# Patient Record
Sex: Female | Born: 1985 | Race: Asian | Hispanic: No | Marital: Married | Smoking: Never smoker
Health system: Southern US, Community
[De-identification: ages and names within clinical notes are randomized; demographics above are authoritative.]

## PROBLEM LIST (undated history)

## (undated) ENCOUNTER — Inpatient Hospital Stay (HOSPITAL_COMMUNITY): Payer: Self-pay

## (undated) DIAGNOSIS — Z789 Other specified health status: Secondary | ICD-10-CM

## (undated) HISTORY — PX: NO PAST SURGERIES: SHX2092

---

## 2018-01-25 LAB — OB RESULTS CONSOLE HIV ANTIBODY (ROUTINE TESTING): HIV: NONREACTIVE

## 2018-01-25 LAB — OB RESULTS CONSOLE RUBELLA ANTIBODY, IGM: Rubella: IMMUNE

## 2018-01-25 LAB — OB RESULTS CONSOLE ABO/RH: RH Type: POSITIVE

## 2018-01-25 LAB — OB RESULTS CONSOLE GC/CHLAMYDIA
Chlamydia: NEGATIVE
Gonorrhea: NEGATIVE

## 2018-01-25 LAB — OB RESULTS CONSOLE HEPATITIS B SURFACE ANTIGEN: HEP B S AG: NEGATIVE

## 2018-02-18 ENCOUNTER — Encounter (HOSPITAL_COMMUNITY): Payer: Self-pay | Admitting: *Deleted

## 2018-02-18 ENCOUNTER — Observation Stay (HOSPITAL_COMMUNITY)
Admission: AD | Admit: 2018-02-18 | Payer: BLUE CROSS/BLUE SHIELD | Source: Ambulatory Visit | Admitting: Obstetrics and Gynecology

## 2018-02-18 ENCOUNTER — Inpatient Hospital Stay (HOSPITAL_BASED_OUTPATIENT_CLINIC_OR_DEPARTMENT_OTHER): Payer: BLUE CROSS/BLUE SHIELD

## 2018-02-18 ENCOUNTER — Inpatient Hospital Stay (HOSPITAL_COMMUNITY)
Admission: AD | Admit: 2018-02-18 | Discharge: 2018-02-18 | Disposition: A | Discharge status: Home / Self Care | Payer: BLUE CROSS/BLUE SHIELD | Source: Ambulatory Visit | Attending: Obstetrics and Gynecology | Admitting: Obstetrics and Gynecology

## 2018-02-18 DIAGNOSIS — Z3A20 20 weeks gestation of pregnancy: Secondary | ICD-10-CM | POA: Insufficient documentation

## 2018-02-18 DIAGNOSIS — O4702 False labor before 37 completed weeks of gestation, second trimester: Secondary | ICD-10-CM

## 2018-02-18 DIAGNOSIS — O4412 Placenta previa with hemorrhage, second trimester: Secondary | ICD-10-CM

## 2018-02-18 DIAGNOSIS — M549 Dorsalgia, unspecified: Secondary | ICD-10-CM | POA: Diagnosis present

## 2018-02-18 DIAGNOSIS — O26892 Other specified pregnancy related conditions, second trimester: Secondary | ICD-10-CM | POA: Diagnosis not present

## 2018-02-18 DIAGNOSIS — O4692 Antepartum hemorrhage, unspecified, second trimester: Secondary | ICD-10-CM | POA: Diagnosis not present

## 2018-02-18 DIAGNOSIS — O479 False labor, unspecified: Secondary | ICD-10-CM

## 2018-02-18 DIAGNOSIS — O26852 Spotting complicating pregnancy, second trimester: Secondary | ICD-10-CM

## 2018-02-18 DIAGNOSIS — O4402 Placenta previa specified as without hemorrhage, second trimester: Secondary | ICD-10-CM | POA: Diagnosis not present

## 2018-02-18 DIAGNOSIS — O4700 False labor before 37 completed weeks of gestation, unspecified trimester: Secondary | ICD-10-CM

## 2018-02-18 HISTORY — DX: Other specified health status: Z78.9

## 2018-02-18 LAB — URINALYSIS, ROUTINE W REFLEX MICROSCOPIC
BILIRUBIN URINE: NEGATIVE
Glucose, UA: NEGATIVE mg/dL
Hgb urine dipstick: NEGATIVE
KETONES UR: NEGATIVE mg/dL
Leukocytes, UA: NEGATIVE
NITRITE: NEGATIVE
PROTEIN: NEGATIVE mg/dL
Specific Gravity, Urine: 1.004 — ABNORMAL LOW (ref 1.005–1.030)
pH: 8 (ref 5.0–8.0)

## 2018-02-18 LAB — ABO/RH: ABO/RH(D): A POS

## 2018-02-18 MED ORDER — CYCLOBENZAPRINE HCL 10 MG PO TABS
10.0000 mg | ORAL_TABLET | Freq: Once | ORAL | Status: AC
  Administered 2018-02-18: 10 mg via ORAL
  Filled 2018-02-18: qty 1

## 2018-02-18 MED ORDER — LACTATED RINGERS IV BOLUS
1000.0000 mL | Freq: Once | INTRAVENOUS | Status: AC
  Administered 2018-02-18: 1000 mL via INTRAVENOUS

## 2018-02-18 MED ORDER — LACTATED RINGERS IV BOLUS
500.0000 mL | Freq: Once | INTRAVENOUS | Status: AC
  Administered 2018-02-18: 500 mL via INTRAVENOUS

## 2018-02-18 MED ORDER — NIFEDIPINE 10 MG PO CAPS
10.0000 mg | ORAL_CAPSULE | ORAL | Status: AC
  Administered 2018-02-18 (×3): 10 mg via ORAL
  Filled 2018-02-18 (×3): qty 1

## 2018-02-18 MED ORDER — NIFEDIPINE ER OSMOTIC RELEASE 30 MG PO TB24: 30.0000 mg | ORAL_TABLET | Freq: Two times a day (BID) | ORAL | 0 refills | Status: DC

## 2018-02-18 NOTE — Discharge Instructions (Signed)

## 2018-02-18 NOTE — MAU Provider Note (Signed)
History     CSN: [id number]  Arrival date and time: 02/18/18 1011   First Provider Initiated Contact with Patient 02/18/18 1053      Chief Complaint  Patient presents with  . Vaginal Discharge  . Back Pain   HPI  Ms.  Yvonne Watts is a 32 y.o. year old G1P0 female at [redacted]w[redacted]d weeks gestation who presents to MAU reporting brown, mucousy discharge, lower RT side, and intermittent back pain. She has a h/o placenta previa. There is no blood type listed on her prenatal record in Epic. The patient looked on her patient portal and found it to be A positive. She started [facility] with [facility] (Dr. Juliene Pina), but has transferred her care [facility] "about 1 month ago."  Past Medical History:  Diagnosis Date  . Medical history non-contributory     Past Surgical History:  Procedure Laterality Date  . NO PAST SURGERIES      History reviewed. No pertinent family history.  Social History   Tobacco Use  . Smoking status: Never Smoker  . Smokeless tobacco: Never Used  Substance Use Topics  . Alcohol use: Not Currently  . Drug use: Never    Allergies: No Known Allergies  No medications prior to admission.    Review of Systems  Constitutional: Negative.   HENT: Negative.   Eyes: Negative.   Respiratory: Negative.   Cardiovascular: Negative.   Gastrointestinal: Positive for abdominal pain (cramping).  Endocrine: Negative.   Genitourinary: Positive for vaginal bleeding (brown, mucous).  Musculoskeletal: Positive for back pain (lower RT side).  Skin: Negative.   Allergic/Immunologic: Negative.   Neurological: Negative.   Hematological: Negative.   Psychiatric/Behavioral: Negative.    Physical Exam   Blood pressure 119/72, pulse 96, temperature 97.6 F (36.4 C), temperature source Oral, resp. rate 16, SpO2 98 %.  Physical Exam  Nursing note and vitals reviewed. Constitutional: She is oriented to person, place, and time. She appears well-developed and well-nourished.  HENT:   Head: Normocephalic and atraumatic.  Eyes: Pupils are equal, round, and reactive to light.  Neck: Normal range of motion.  Cardiovascular: Normal rate.  Respiratory: Effort normal.  GI: Soft.  Musculoskeletal: Normal range of motion.  Neurological: She is alert and oriented to person, place, and time.  Skin: Skin is warm and dry.  Psychiatric: She has a normal mood and affect. Her behavior is normal. Judgment and thought content normal.    MAU Course  Procedures  MDM CCUA ABO/Rh OB MFM Limited U/S FHTs by doppler: 155 bpm Flexeril 10 mg -- improved pain from 4-5/10 to 3/10 Toco only: regular every 1-3 mins  LR 1000 ml bolus x 2 liters Procardia 10 mg every 20 mins x 3 doses Regular diet   *Consult with Dr. Shawnie Pons @ 1435 - notified of patient's complaints, assessments, lab & U/S results, tx plan discuss bleeding precautions and risks of miscarriage with placenta previa, Flexeril for back pain, F/U with [facility] - ok to d/c home, agrees with plan // Update Dr. Shawnie Pons @ 1600 on patient's request to be monitored for contractions & toco results -- can offer Indocin x 3 days and F/U with OB or can give Procardia 10 mg every 20 mins here and send home with Rx for and F/U with OB // Update Dr. Shawnie Pons @ 1722: ok to d/c home with Rx for Procardia 30 mg BID, F/U with OB early next week.  Results for orders placed or performed during the hospital encounter  of 02/18/18 (from the past 24 hour(s))  Urinalysis, Routine w reflex microscopic     Status: Abnormal   Collection Time: 02/18/18 10:37 AM  Result Value Ref Range   Color, Urine STRAW (A) YELLOW   APPearance CLEAR CLEAR   Specific Gravity, Urine 1.004 (L) 1.005 - 1.030   pH 8.0 5.0 - 8.0   Glucose, UA NEGATIVE NEGATIVE mg/dL   Hgb urine dipstick NEGATIVE NEGATIVE   Bilirubin Urine NEGATIVE NEGATIVE   Ketones, ur NEGATIVE NEGATIVE mg/dL   Protein, ur NEGATIVE NEGATIVE mg/dL   Nitrite NEGATIVE NEGATIVE   Leukocytes, UA NEGATIVE  NEGATIVE   [place] MFM Ob Limited  Result Date: 02/18/2018 ----------------------------------------------------------------------  OBSTETRICS REPORT                        (Signed Final 02/18/2018 02:30 pm) ---------------------------------------------------------------------- Patient Info  ID #:       [id number]                          D.O.B.:  [birth year] (32 yrs)  Name:       Yvonne Watts                         Visit Date: 02/18/2018 01:15 pm ---------------------------------------------------------------------- Performed By  Performed By:     Birdena Crandall        Ref. Address:      [address]                                                              [place], [place]                                                              [address]  Attending:        Toma Copier MD            Secondary Phy.:    MAU Nursing-                                                              MAU/Triage  Referred By:      Dia Sitter                Location:          [facility]  Oslo Huntsman CNM ---------------------------------------------------------------------- Orders   #  Description                           Code        Ordered By   1  [place] MFM OB LIMITED                     76815.01    Ida Milbrath   2  [place] MFM OB TRANSVAGINAL                [id number]     Arleen Bar  ----------------------------------------------------------------------   #  Order #                     Accession #                Episode #   1  [id number]                   [id number]                 [id number]   2  [id number]                   [id number]                 [id number]  ---------------------------------------------------------------------- Indications   Placenta previa with hemorrhage, second        O44.12   trimester   Vaginal bleeding in pregnancy, second          O46.92   trimester   [redacted] weeks gestation of pregnancy                Z3A.20   ---------------------------------------------------------------------- Fetal Evaluation  Num Of Fetuses:          1  Fetal Heart Rate(bpm):   138  Cardiac Activity:        Observed  Presentation:            Cephalic  Placenta:                Posterior Previa  P. Cord Insertion:       Marginal insertion  Amniotic Fluid  AFI FV:      Within normal limits                              Largest Pocket(cm)                              5.19 ---------------------------------------------------------------------- OB History  Gravidity:    1 ---------------------------------------------------------------------- Gestational Age  LMP:           20w 1d        Date:  09/30/17                 EDD:   07/07/18  Best:          20w 1d     Det. By:  LMP  (09/30/17)          EDD:   07/07/18 ---------------------------------------------------------------------- Anatomy  Ventricles:            Appears normal         Bladder:                Appears normal  Kidneys:  Appear normal          Lower Extremities:      Visualized ---------------------------------------------------------------------- Cervix Uterus Adnexa  Cervix  Length:           3.45  cm.  Normal appearance by transvaginal scan ---------------------------------------------------------------------- Impression  IUP at 20w 1d  Placenta Previa with Marginal placental cord insertion  adjacent to cervix os  Cervical Length = 3.45 cms ---------------------------------------------------------------------- Recommendations  Bleeding precautions with reevaluation in 2-3 weeks. ----------------------------------------------------------------------                      Toma Copier, MD Electronically Signed Final Report   02/18/2018 02:30 pm ----------------------------------------------------------------------   Assessment and Plan  Preterm contractions - Rx for Procardia 30 mg BID given - Information provided on preventing PTL & PTB   Spotting affecting pregnancy in second  trimester  - Advised that spotting is from placenta previa - Advised to return to MAU for blood soaking through 1 pad/hr  Placenta previa antepartum in second trimester - Information provided on placenta previa - Advised to continue pelvic rest - Call OB for BRB   - Discharge patient - Call placed to [facility] answering service (spoke with [place]) to request message be given to office staff to get patient seen either Monday 11/11 or 11/12. - Patient verbalized an understanding of the plan of care and agrees.    Raelyn Mora, MSN, CNM 02/18/2018, 10:53 AM

## 2018-02-18 NOTE — MAU Note (Signed)
Yvonne Watts is a 32 y.o. at [redacted]w[redacted]d here in MAU reporting: +brown mucus discharge +lower right back pain. intermittent Pain score: 4/10 Reports that she has a placenta previa Vitals:   02/18/18 1035  BP: 119/72  Pulse: 96  Resp: 16  Temp: 97.6 F (36.4 C)  SpO2: 98%     +FM Lab orders placed from triage: ua

## 2018-06-17 ENCOUNTER — Encounter (HOSPITAL_COMMUNITY): Payer: Self-pay | Admitting: *Deleted

## 2018-06-17 ENCOUNTER — Inpatient Hospital Stay (HOSPITAL_COMMUNITY): Payer: BLUE CROSS/BLUE SHIELD | Admitting: Anesthesiology

## 2018-06-17 ENCOUNTER — Other Ambulatory Visit: Payer: Self-pay

## 2018-06-17 ENCOUNTER — Inpatient Hospital Stay (HOSPITAL_COMMUNITY)
Admission: AD | Admit: 2018-06-17 | Discharge: 2018-06-20 | DRG: 788 | Disposition: A | Discharge status: Home / Self Care | Payer: BLUE CROSS/BLUE SHIELD | Attending: Obstetrics and Gynecology | Admitting: Obstetrics and Gynecology

## 2018-06-17 ENCOUNTER — Encounter (HOSPITAL_COMMUNITY)
Admission: AD | Disposition: A | Discharge status: Home / Self Care | Payer: Self-pay | Source: Home / Self Care | Attending: Obstetrics and Gynecology

## 2018-06-17 DIAGNOSIS — O358XX0 Maternal care for other (suspected) fetal abnormality and damage, not applicable or unspecified: Principal | ICD-10-CM | POA: Diagnosis present

## 2018-06-17 DIAGNOSIS — O9081 Anemia of the puerperium: Secondary | ICD-10-CM | POA: Diagnosis not present

## 2018-06-17 DIAGNOSIS — O26893 Other specified pregnancy related conditions, third trimester: Secondary | ICD-10-CM | POA: Diagnosis present

## 2018-06-17 DIAGNOSIS — O4700 False labor before 37 completed weeks of gestation, unspecified trimester: Secondary | ICD-10-CM

## 2018-06-17 DIAGNOSIS — O479 False labor, unspecified: Secondary | ICD-10-CM

## 2018-06-17 DIAGNOSIS — O26852 Spotting complicating pregnancy, second trimester: Secondary | ICD-10-CM

## 2018-06-17 DIAGNOSIS — Z3A37 37 weeks gestation of pregnancy: Secondary | ICD-10-CM | POA: Diagnosis not present

## 2018-06-17 DIAGNOSIS — Z98891 History of uterine scar from previous surgery: Secondary | ICD-10-CM

## 2018-06-17 LAB — CBC
HCT: 32.9 % — ABNORMAL LOW (ref 36.0–46.0)
HEMOGLOBIN: 10.4 g/dL — AB (ref 12.0–15.0)
MCH: 27.7 pg (ref 26.0–34.0)
MCHC: 31.6 g/dL (ref 30.0–36.0)
MCV: 87.7 fL (ref 80.0–100.0)
Platelets: 223 10*3/uL (ref 150–400)
RBC: 3.75 MIL/uL — ABNORMAL LOW (ref 3.87–5.11)
RDW: 12.9 % (ref 11.5–15.5)
WBC: 7.8 10*3/uL (ref 4.0–10.5)
nRBC: 0 % (ref 0.0–0.2)

## 2018-06-17 LAB — ABO/RH: ABO/RH(D): A POS

## 2018-06-17 LAB — OB RESULTS CONSOLE GBS: GBS: NEGATIVE

## 2018-06-17 LAB — POCT FERN TEST: POCT FERN TEST: POSITIVE

## 2018-06-17 LAB — TYPE AND SCREEN
ABO/RH(D): A POS
Antibody Screen: NEGATIVE

## 2018-06-17 LAB — RPR: RPR Ser Ql: NONREACTIVE

## 2018-06-17 SURGERY — Surgical Case
Anesthesia: Epidural

## 2018-06-17 MED ORDER — OXYTOCIN BOLUS FROM INFUSION: 500.0000 mL | Freq: Once | INTRAVENOUS | Status: DC

## 2018-06-17 MED ORDER — SODIUM CHLORIDE (PF) 0.9 % IJ SOLN
INTRAMUSCULAR | Status: DC | PRN
  Administered 2018-06-17: 12 mL/h via EPIDURAL

## 2018-06-17 MED ORDER — NALBUPHINE HCL 10 MG/ML IJ SOLN: 5.0000 mg | Freq: Once | INTRAMUSCULAR | Status: DC | PRN

## 2018-06-17 MED ORDER — SCOPOLAMINE 1 MG/3DAYS TD PT72
MEDICATED_PATCH | TRANSDERMAL | Status: AC
  Filled 2018-06-17: qty 1

## 2018-06-17 MED ORDER — DIBUCAINE 1 % RE OINT: 1.0000 "application " | TOPICAL_OINTMENT | RECTAL | Status: DC | PRN

## 2018-06-17 MED ORDER — OXYCODONE-ACETAMINOPHEN 5-325 MG PO TABS: 1.0000 | ORAL_TABLET | ORAL | Status: DC | PRN

## 2018-06-17 MED ORDER — PHENYLEPHRINE 40 MCG/ML (10ML) SYRINGE FOR IV PUSH (FOR BLOOD PRESSURE SUPPORT)
80.0000 ug | PREFILLED_SYRINGE | INTRAVENOUS | Status: DC | PRN
  Filled 2018-06-17: qty 10

## 2018-06-17 MED ORDER — SCOPOLAMINE 1 MG/3DAYS TD PT72: 1.0000 | MEDICATED_PATCH | Freq: Once | TRANSDERMAL | Status: DC

## 2018-06-17 MED ORDER — HYDROMORPHONE HCL 1 MG/ML IJ SOLN: 0.2500 mg | INTRAMUSCULAR | Status: DC | PRN

## 2018-06-17 MED ORDER — OXYTOCIN 40 UNITS IN NORMAL SALINE INFUSION - SIMPLE MED: 2.5000 [IU]/h | INTRAVENOUS | Status: AC

## 2018-06-17 MED ORDER — KETOROLAC TROMETHAMINE 30 MG/ML IJ SOLN
30.0000 mg | Freq: Four times a day (QID) | INTRAMUSCULAR | Status: AC | PRN
  Administered 2018-06-17: 30 mg via INTRAMUSCULAR

## 2018-06-17 MED ORDER — OXYTOCIN 40 UNITS IN NORMAL SALINE INFUSION - SIMPLE MED
1.0000 m[IU]/min | INTRAVENOUS | Status: DC
  Administered 2018-06-17: 2 m[IU]/min via INTRAVENOUS
  Filled 2018-06-17: qty 1000

## 2018-06-17 MED ORDER — DIPHENHYDRAMINE HCL 25 MG PO CAPS: 25.0000 mg | ORAL_CAPSULE | Freq: Four times a day (QID) | ORAL | Status: DC | PRN

## 2018-06-17 MED ORDER — LIDOCAINE-EPINEPHRINE (PF) 2 %-1:200000 IJ SOLN
INTRAMUSCULAR | Status: DC | PRN
  Administered 2018-06-17: 3 mL via EPIDURAL
  Administered 2018-06-17: 4 mL via EPIDURAL
  Administered 2018-06-17: 7 mL via EPIDURAL

## 2018-06-17 MED ORDER — PHENYLEPHRINE 40 MCG/ML (10ML) SYRINGE FOR IV PUSH (FOR BLOOD PRESSURE SUPPORT)
40.0000 ug | PREFILLED_SYRINGE | Freq: Once | INTRAVENOUS | Status: AC
  Administered 2018-06-17: 40 ug via INTRAVENOUS

## 2018-06-17 MED ORDER — DEXAMETHASONE SODIUM PHOSPHATE 4 MG/ML IJ SOLN
INTRAMUSCULAR | Status: DC | PRN
  Administered 2018-06-17: 4 mg via INTRAVENOUS

## 2018-06-17 MED ORDER — CEFAZOLIN SODIUM-DEXTROSE 2-4 GM/100ML-% IV SOLN: 2.0000 g | Freq: Once | INTRAVENOUS | Status: DC

## 2018-06-17 MED ORDER — ONDANSETRON HCL 4 MG/2ML IJ SOLN: 4.0000 mg | Freq: Three times a day (TID) | INTRAMUSCULAR | Status: DC | PRN

## 2018-06-17 MED ORDER — DIPHENHYDRAMINE HCL 50 MG/ML IJ SOLN: 12.5000 mg | INTRAMUSCULAR | Status: DC | PRN

## 2018-06-17 MED ORDER — FENTANYL CITRATE (PF) 100 MCG/2ML IJ SOLN: 50.0000 ug | INTRAMUSCULAR | Status: DC | PRN

## 2018-06-17 MED ORDER — DEXAMETHASONE SODIUM PHOSPHATE 10 MG/ML IJ SOLN
INTRAMUSCULAR | Status: AC
  Filled 2018-06-17: qty 1

## 2018-06-17 MED ORDER — ACETAMINOPHEN 325 MG PO TABS
650.0000 mg | ORAL_TABLET | ORAL | Status: DC | PRN
  Administered 2018-06-18: 650 mg via ORAL
  Filled 2018-06-17 (×2): qty 2

## 2018-06-17 MED ORDER — SIMETHICONE 80 MG PO CHEW
80.0000 mg | CHEWABLE_TABLET | Freq: Three times a day (TID) | ORAL | Status: DC
  Administered 2018-06-18 – 2018-06-20 (×5): 80 mg via ORAL
  Filled 2018-06-17 (×6): qty 1

## 2018-06-17 MED ORDER — DIPHENHYDRAMINE HCL 25 MG PO CAPS: 25.0000 mg | ORAL_CAPSULE | ORAL | Status: DC | PRN

## 2018-06-17 MED ORDER — OXYTOCIN 40 UNITS IN NORMAL SALINE INFUSION - SIMPLE MED
INTRAVENOUS | Status: DC | PRN
  Administered 2018-06-17: 40 [IU] via INTRAVENOUS

## 2018-06-17 MED ORDER — MEPERIDINE HCL 25 MG/ML IJ SOLN: 6.2500 mg | INTRAMUSCULAR | Status: DC | PRN

## 2018-06-17 MED ORDER — ONDANSETRON HCL 4 MG/2ML IJ SOLN
INTRAMUSCULAR | Status: DC | PRN
  Administered 2018-06-17: 4 mg via INTRAVENOUS

## 2018-06-17 MED ORDER — FENTANYL-BUPIVACAINE-NACL 0.5-0.125-0.9 MG/250ML-% EP SOLN: 12.0000 mL/h | EPIDURAL | Status: DC | PRN

## 2018-06-17 MED ORDER — FENTANYL-BUPIVACAINE-NACL 0.5-0.125-0.9 MG/250ML-% EP SOLN
EPIDURAL | Status: AC
  Filled 2018-06-17: qty 250

## 2018-06-17 MED ORDER — SENNOSIDES-DOCUSATE SODIUM 8.6-50 MG PO TABS
2.0000 | ORAL_TABLET | ORAL | Status: DC
  Administered 2018-06-17 – 2018-06-19 (×3): 2 via ORAL
  Filled 2018-06-17 (×3): qty 2

## 2018-06-17 MED ORDER — LIDOCAINE-EPINEPHRINE (PF) 2 %-1:200000 IJ SOLN
INTRAMUSCULAR | Status: AC
  Filled 2018-06-17: qty 10

## 2018-06-17 MED ORDER — LACTATED RINGERS IV SOLN
INTRAVENOUS | Status: DC
  Administered 2018-06-17 (×5): via INTRAVENOUS

## 2018-06-17 MED ORDER — LACTATED RINGERS IV SOLN
INTRAVENOUS | Status: DC
  Administered 2018-06-17: 19:00:00 via INTRAVENOUS

## 2018-06-17 MED ORDER — OXYCODONE-ACETAMINOPHEN 5-325 MG PO TABS: 2.0000 | ORAL_TABLET | ORAL | Status: DC | PRN

## 2018-06-17 MED ORDER — EPHEDRINE 5 MG/ML INJ
5.0000 mg | Freq: Once | INTRAVENOUS | Status: AC
  Administered 2018-06-17: 5 mg via INTRAVENOUS

## 2018-06-17 MED ORDER — KETOROLAC TROMETHAMINE 30 MG/ML IJ SOLN
INTRAMUSCULAR | Status: AC
  Filled 2018-06-17: qty 1

## 2018-06-17 MED ORDER — IBUPROFEN 800 MG PO TABS
800.0000 mg | ORAL_TABLET | Freq: Three times a day (TID) | ORAL | Status: DC
  Administered 2018-06-17 – 2018-06-20 (×8): 800 mg via ORAL
  Filled 2018-06-17 (×8): qty 1

## 2018-06-17 MED ORDER — TETANUS-DIPHTH-ACELL PERTUSSIS 5-2.5-18.5 LF-MCG/0.5 IM SUSP: 0.5000 mL | Freq: Once | INTRAMUSCULAR | Status: DC

## 2018-06-17 MED ORDER — KETOROLAC TROMETHAMINE 30 MG/ML IJ SOLN: 30.0000 mg | Freq: Four times a day (QID) | INTRAMUSCULAR | Status: AC | PRN

## 2018-06-17 MED ORDER — ACETAMINOPHEN 500 MG PO TABS
1000.0000 mg | ORAL_TABLET | Freq: Four times a day (QID) | ORAL | Status: AC
  Administered 2018-06-17 – 2018-06-18 (×3): 1000 mg via ORAL
  Filled 2018-06-17 (×3): qty 2

## 2018-06-17 MED ORDER — KETOROLAC TROMETHAMINE 30 MG/ML IJ SOLN: 30.0000 mg | Freq: Once | INTRAMUSCULAR | Status: DC | PRN

## 2018-06-17 MED ORDER — SIMETHICONE 80 MG PO CHEW
80.0000 mg | CHEWABLE_TABLET | ORAL | Status: DC
  Administered 2018-06-17 – 2018-06-19 (×3): 80 mg via ORAL
  Filled 2018-06-17 (×3): qty 1

## 2018-06-17 MED ORDER — NALOXONE HCL 0.4 MG/ML IJ SOLN: 0.4000 mg | INTRAMUSCULAR | Status: DC | PRN

## 2018-06-17 MED ORDER — NALOXONE HCL 4 MG/10ML IJ SOLN
1.0000 ug/kg/h | INTRAVENOUS | Status: DC | PRN
  Filled 2018-06-17: qty 5

## 2018-06-17 MED ORDER — LACTATED RINGERS AMNIOINFUSION
INTRAVENOUS | Status: DC
  Administered 2018-06-17: 10:00:00 via INTRAUTERINE

## 2018-06-17 MED ORDER — NALBUPHINE HCL 10 MG/ML IJ SOLN: 5.0000 mg | INTRAMUSCULAR | Status: DC | PRN

## 2018-06-17 MED ORDER — COCONUT OIL OIL: 1.0000 "application " | TOPICAL_OIL | Status: DC | PRN

## 2018-06-17 MED ORDER — MENTHOL 3 MG MT LOZG: 1.0000 | LOZENGE | OROMUCOSAL | Status: DC | PRN

## 2018-06-17 MED ORDER — OXYCODONE HCL 5 MG PO TABS
5.0000 mg | ORAL_TABLET | ORAL | Status: DC | PRN
  Administered 2018-06-18 – 2018-06-20 (×3): 5 mg via ORAL
  Filled 2018-06-17 (×4): qty 1

## 2018-06-17 MED ORDER — SODIUM CHLORIDE 0.9% FLUSH: 3.0000 mL | INTRAVENOUS | Status: DC | PRN

## 2018-06-17 MED ORDER — EPHEDRINE 5 MG/ML INJ
10.0000 mg | INTRAVENOUS | Status: DC | PRN
  Filled 2018-06-17: qty 10

## 2018-06-17 MED ORDER — LACTATED RINGERS IV SOLN
INTRAVENOUS | Status: DC | PRN
  Administered 2018-06-17: 12:00:00 via INTRAVENOUS

## 2018-06-17 MED ORDER — OXYTOCIN 40 UNITS IN NORMAL SALINE INFUSION - SIMPLE MED
INTRAVENOUS | Status: AC
  Filled 2018-06-17: qty 1000

## 2018-06-17 MED ORDER — MORPHINE SULFATE (PF) 0.5 MG/ML IJ SOLN
INTRAMUSCULAR | Status: DC | PRN
  Administered 2018-06-17: 3 ug via EPIDURAL

## 2018-06-17 MED ORDER — LIDOCAINE HCL (PF) 1 % IJ SOLN
INTRAMUSCULAR | Status: DC | PRN
  Administered 2018-06-17: 6 mL via EPIDURAL

## 2018-06-17 MED ORDER — ONDANSETRON HCL 4 MG/2ML IJ SOLN
INTRAMUSCULAR | Status: AC
  Filled 2018-06-17: qty 2

## 2018-06-17 MED ORDER — LACTATED RINGERS IV SOLN: 500.0000 mL | Freq: Once | INTRAVENOUS | Status: DC

## 2018-06-17 MED ORDER — PHENYLEPHRINE 40 MCG/ML (10ML) SYRINGE FOR IV PUSH (FOR BLOOD PRESSURE SUPPORT): 80.0000 ug | PREFILLED_SYRINGE | INTRAVENOUS | Status: DC | PRN

## 2018-06-17 MED ORDER — FLEET ENEMA 7-19 GM/118ML RE ENEM: 1.0000 | ENEMA | RECTAL | Status: DC | PRN

## 2018-06-17 MED ORDER — LACTATED RINGERS IV SOLN: 500.0000 mL | INTRAVENOUS | Status: DC | PRN

## 2018-06-17 MED ORDER — WITCH HAZEL-GLYCERIN EX PADS: 1.0000 "application " | MEDICATED_PAD | CUTANEOUS | Status: DC | PRN

## 2018-06-17 MED ORDER — CEFAZOLIN SODIUM-DEXTROSE 2-3 GM-%(50ML) IV SOLR
INTRAVENOUS | Status: DC | PRN
  Administered 2018-06-17: 2 g via INTRAVENOUS

## 2018-06-17 MED ORDER — PRENATAL MULTIVITAMIN CH
1.0000 | ORAL_TABLET | Freq: Every day | ORAL | Status: DC
  Administered 2018-06-18 – 2018-06-19 (×2): 1 via ORAL
  Filled 2018-06-17 (×2): qty 1

## 2018-06-17 MED ORDER — OXYTOCIN 40 UNITS IN NORMAL SALINE INFUSION - SIMPLE MED: 2.5000 [IU]/h | INTRAVENOUS | Status: DC

## 2018-06-17 MED ORDER — SOD CITRATE-CITRIC ACID 500-334 MG/5ML PO SOLN
30.0000 mL | ORAL | Status: DC | PRN
  Administered 2018-06-17: 30 mL via ORAL
  Filled 2018-06-17: qty 15

## 2018-06-17 MED ORDER — ZOLPIDEM TARTRATE 5 MG PO TABS: 5.0000 mg | ORAL_TABLET | Freq: Every evening | ORAL | Status: DC | PRN

## 2018-06-17 MED ORDER — TERBUTALINE SULFATE 1 MG/ML IJ SOLN: 0.2500 mg | Freq: Once | INTRAMUSCULAR | Status: DC | PRN

## 2018-06-17 MED ORDER — PHENYLEPHRINE HCL 10 MG/ML IJ SOLN
INTRAMUSCULAR | Status: DC | PRN
  Administered 2018-06-17 (×2): 40 ug via INTRAVENOUS
  Administered 2018-06-17: 80 ug via INTRAVENOUS
  Administered 2018-06-17: 40 ug via INTRAVENOUS
  Administered 2018-06-17: 80 ug via INTRAVENOUS
  Administered 2018-06-17: 120 ug via INTRAVENOUS

## 2018-06-17 MED ORDER — EPHEDRINE 5 MG/ML INJ: 10.0000 mg | INTRAVENOUS | Status: DC | PRN

## 2018-06-17 MED ORDER — ACETAMINOPHEN 325 MG PO TABS: 650.0000 mg | ORAL_TABLET | ORAL | Status: DC | PRN

## 2018-06-17 MED ORDER — PROMETHAZINE HCL 25 MG/ML IJ SOLN: 6.2500 mg | INTRAMUSCULAR | Status: DC | PRN

## 2018-06-17 MED ORDER — MORPHINE SULFATE (PF) 0.5 MG/ML IJ SOLN
INTRAMUSCULAR | Status: AC
  Filled 2018-06-17: qty 10

## 2018-06-17 MED ORDER — LIDOCAINE HCL (PF) 1 % IJ SOLN: 30.0000 mL | INTRAMUSCULAR | Status: DC | PRN

## 2018-06-17 MED ORDER — SIMETHICONE 80 MG PO CHEW: 80.0000 mg | CHEWABLE_TABLET | ORAL | Status: DC | PRN

## 2018-06-17 MED ORDER — ONDANSETRON HCL 4 MG/2ML IJ SOLN: 4.0000 mg | Freq: Four times a day (QID) | INTRAMUSCULAR | Status: DC | PRN

## 2018-06-17 MED ORDER — METOCLOPRAMIDE HCL 5 MG/ML IJ SOLN
INTRAMUSCULAR | Status: AC
  Filled 2018-06-17: qty 2

## 2018-06-17 SURGICAL SUPPLY — 36 items
BENZOIN TINCTURE PRP APPL 2/3 (GAUZE/BANDAGES/DRESSINGS) ×3 IMPLANT
CHLORAPREP W/TINT 26ML (MISCELLANEOUS) ×3 IMPLANT
CLAMP CORD UMBIL (MISCELLANEOUS) IMPLANT
CLOSURE STERI STRIP 1/2 X4 (GAUZE/BANDAGES/DRESSINGS) ×3 IMPLANT
CLOSURE WOUND 1/2 X4 (GAUZE/BANDAGES/DRESSINGS)
CLOTH BEACON ORANGE TIMEOUT ST (SAFETY) ×3 IMPLANT
DRSG OPSITE POSTOP 4X10 (GAUZE/BANDAGES/DRESSINGS) ×3 IMPLANT
ELECT REM PT RETURN 9FT ADLT (ELECTROSURGICAL) ×3
ELECTRODE REM PT RTRN 9FT ADLT (ELECTROSURGICAL) ×1 IMPLANT
EXTRACTOR VACUUM KIWI (MISCELLANEOUS) IMPLANT
GLOVE BIO SURGEON STRL SZ 6.5 (GLOVE) ×2 IMPLANT
GLOVE BIO SURGEONS STRL SZ 6.5 (GLOVE) ×1
GLOVE BIOGEL PI IND STRL 7.0 (GLOVE) ×1 IMPLANT
GLOVE BIOGEL PI INDICATOR 7.0 (GLOVE) ×2
GOWN STRL REUS W/TWL LRG LVL3 (GOWN DISPOSABLE) ×6 IMPLANT
KIT ABG SYR 3ML LUER SLIP (SYRINGE) IMPLANT
NEEDLE HYPO 25X5/8 SAFETYGLIDE (NEEDLE) IMPLANT
NS IRRIG 1000ML POUR BTL (IV SOLUTION) ×3 IMPLANT
PACK C SECTION WH (CUSTOM PROCEDURE TRAY) ×3 IMPLANT
PAD OB MATERNITY 4.3X12.25 (PERSONAL CARE ITEMS) ×3 IMPLANT
PENCIL SMOKE EVAC W/HOLSTER (ELECTROSURGICAL) ×3 IMPLANT
RTRCTR C-SECT PINK 25CM LRG (MISCELLANEOUS) ×3 IMPLANT
STRIP CLOSURE SKIN 1/2X4 (GAUZE/BANDAGES/DRESSINGS) IMPLANT
SUT CHROMIC 1 CTX 36 (SUTURE) ×6 IMPLANT
SUT PLAIN 0 NONE (SUTURE) IMPLANT
SUT PLAIN 2 0 XLH (SUTURE) ×3 IMPLANT
SUT VIC AB 0 CT1 27 (SUTURE) ×4
SUT VIC AB 0 CT1 27XBRD ANBCTR (SUTURE) ×2 IMPLANT
SUT VIC AB 2-0 CT1 27 (SUTURE) ×2
SUT VIC AB 2-0 CT1 TAPERPNT 27 (SUTURE) ×1 IMPLANT
SUT VIC AB 3-0 CT1 27 (SUTURE)
SUT VIC AB 3-0 CT1 TAPERPNT 27 (SUTURE) IMPLANT
SUT VIC AB 4-0 KS 27 (SUTURE) ×3 IMPLANT
TOWEL OR 17X24 6PK STRL BLUE (TOWEL DISPOSABLE) ×3 IMPLANT
TRAY FOLEY W/BAG SLVR 14FR LF (SET/KITS/TRAYS/PACK) ×3 IMPLANT
WATER STERILE IRR 1000ML POUR (IV SOLUTION) ×3 IMPLANT

## 2018-06-17 NOTE — Anesthesia Pain Management Evaluation Note (Signed)
  CRNA Pain Management Visit Note  Patient: Yvonne Watts, 33 y.o., female  "Hello I am a member of the anesthesia team at [facility]. We have an anesthesia team available at all times to provide care throughout the hospital, including epidural management and anesthesia for C-section. I don't know your plan for the delivery whether it a natural birth, water birth, IV sedation, nitrous supplementation, doula or epidural, but we want to meet your pain goals."   1.Was your pain managed to your expectations on prior hospitalizations?   No prior hospitalizations  2.What is your expectation for pain management during this hospitalization?     Epidural  3.How can we help you reach that goal? Support prn  Record the patient's initial score and the patient's pain goal.   Pain: 0  Pain Goal: 2 [facility] wants you to be able to say your pain was always managed very well.  [facility] 06/17/2018

## 2018-06-17 NOTE — Transfer of Care (Signed)
Immediate Anesthesia Transfer of Care Note  Patient: Yvonne Watts  Procedure(s) Performed: CESAREAN SECTION (N/A )  Patient Location: PACU  Anesthesia Type:Epidural  Level of Consciousness: awake, alert  and oriented  Airway & Oxygen Therapy: Patient Spontanous Breathing  Post-op Assessment: Report given to RN and Post -op Vital signs reviewed and stable  Post vital signs: Reviewed and stable  Last Vitals:  Vitals Value Taken Time  BP 100/58 06/17/2018 12:49 PM  Temp 37.1 C 06/17/2018 12:49 PM  Pulse 101 06/17/2018 12:55 PM  Resp 17 06/17/2018 12:55 PM  SpO2 100 % 06/17/2018 12:55 PM  Vitals shown include unvalidated device data.  Last Pain:  Vitals:   06/17/18 1249  TempSrc: Axillary  PainSc: 0-No pain      Patients Stated Pain Goal: 2 (06/17/18 0731)  Complications: No apparent anesthesia complications

## 2018-06-17 NOTE — MAU Note (Signed)
Leaking clear fld since 51mn. Occ ctxs but no pain.

## 2018-06-17 NOTE — Progress Notes (Signed)
Patient ID: Yvonne Watts, female   DOB: 02/06/1986, 33 y.o.   MRN: [id number] Pt continued to have variable and late decelerations with loss of variability and rise in baseline to 180  Examined and persistent OP with cervix a rim with left>right remaining, zero station  Attempted pushing for 15 minutes reducing the rim to see if pt could rotate the vertex with pushing and make it past the rim but was not able to do so despite good effort.  VB noted to be more than expected--with h/o recently resolved LLP at 36 weeks and persistent category 2 tracing I d/w pt and husband I feel we need to proceed with c-section.  We have maximized all interventions to improve FHR tracing and never turned the pitocin back on due to persistent category 2.    Counseled on risks and benefits including bleeding, infection and possible damage to bowel and bladder.  Pt agreeable to proceed and OR prepping.

## 2018-06-17 NOTE — Op Note (Signed)
Operative Note    Preoperative Diagnosis Term pregnancy at 37 1/7 weeks Persistent category 2 tracing despite interventions Vaginal bleeding  Postoperative Diagnosis Same with bloody amniotic fluid  Procedure Primary low transverse c-section with two layer closure  Surgeon Huel Cote, MD  Anesthesia Epidural  Fluids: EBL UOP  IVF  Findings A viable female infant in the vertex, OP position.  Apgars 8,9.  Cord pH 7.33 Bloody amniotic fluid, placenta still close to LUS posteriorly.  Normal uterus tubes and ovaries.  Specimen Placenta to L&D  Procedure Note Patient was taken to the operating room where epidural anesthesia was found to be adequate by Allis clamp test. She was prepped and draped in the normal sterile fashion in the dorsal supine position with a leftward tilt. An appropriate time out was performed. A Pfannenstiel skin incision was then made with the scalpel and carried through to the underlying layer of fascia by sharp dissection and Bovie cautery. The fascia was nicked in the midline and the incision was extended laterally with Mayo scissors. The inferior aspect of the incision was grasped Coker clamps and dissected off the underlying rectus muscles. In a similar fashion the superior aspect was dissected off the rectus muscles. Rectus muscles were separated in the midline and the peritoneal cavity entered bluntly. The peritoneal incision was then extended both superiorly and inferiorly with careful attention to avoid both bowel and bladder. The Alexis self-retaining wound retractor was then placed within the incision and the lower uterine segment exposed. The bladder flap was developed with Metzenbaum scissors and pushed away from the lower uterine segment. The lower uterine segment was then incised in a transverse fashion and the cavity itself entered bluntly. The incision was extended bluntly. The infant's head was then lifted and delivered from  the incision without difficulty. The remainder of the infant delivered and the nose and mouth bulb suctioned with the cord clamped and cut as well. The infant was handed off to the waiting pediatricians. The placenta was then spontaneously expressed from the uterus and the uterus cleared of all clots and debris with moist lap sponge. The uterine incision was then repaired in 2 layers the first layer was a running locked layer 1-0 chromic and the second an imbricating layer of the same suture. The tubes and ovaries were inspected and the gutters cleared of all clots and debris. The uterine incision was inspected and found to be hemostatic. All instruments and sponges as well as the Alexis retractor were then removed from the abdomen. The rectus muscles and peritoneum were then reapproximated with an interrupted running suture of 2-0 Vicryl. The fascia was then closed with 0 Vicryl in a running fashion. Subcutaneous tissue was reapproximated with 3-0 plain in a running fashion. The skin was closed with a subcuticular stitch of 4-0 Vicryl on a Keith needle and then reinforced with benzoin and Steri-Strips. At the conclusion of the procedure all instruments and sponge counts were correct. Patient was taken to the recovery room in good condition with her baby accompanying her skin to skin.

## 2018-06-17 NOTE — Progress Notes (Signed)
Dr Senaida Ores notified of pt's admission and status. Aware of SROM at 50mn, ctx pattern, sve, FHR tracing. WIll admit to [facility]

## 2018-06-17 NOTE — Progress Notes (Signed)
Patient ID: Yvonne Watts, female   DOB: February 22, 1986, 33 y.o.   MRN: [id number] Pt comfortable with epidural  FHR category 1 with occasional variable/early decels  afeb VSS  Cervix 6/90/-1  AROM forebag IUPC placed to adjust pitocin OP, try sims and peanut Follow progress

## 2018-06-17 NOTE — Anesthesia Procedure Notes (Signed)
Epidural Patient location during procedure: OB Start time: 06/17/2018 4:45 AM End time: 06/17/2018 4:50 AM  Staffing Anesthesiologist: Bethena Midget, MD  Preanesthetic Checklist Completed: patient identified, site marked, surgical consent, pre-op evaluation, timeout performed, IV checked, risks and benefits discussed and monitors and equipment checked  Epidural Patient position: sitting Prep: site prepped and draped and DuraPrep Patient monitoring: continuous pulse ox and blood pressure Approach: midline Location: L4-L5 Injection technique: LOR air  Needle:  Needle type: Tuohy  Needle gauge: 17 G Needle length: 9 cm and 9 Needle insertion depth: 4 cm Catheter type: closed end flexible Catheter size: 19 Gauge Catheter at skin depth: 9 cm Test dose: negative  Assessment Events: blood not aspirated, injection not painful, no injection resistance, negative IV test and no paresthesia

## 2018-06-17 NOTE — Progress Notes (Signed)
Patient ID: Yvonne Watts, female   DOB: 1985-12-10, 33 y.o.   MRN: [id number] FHR began to have variable decels and then a few late decelerations over last hour.  Pitocin was discontinued to allow baby to recover, and position changes and O2 interventions used.  Still with great variability and +scalp stim.  Baseline prior to decelerations 140-150 and now back to that.  Still some persistent variable decelerations so will begin amnioinfusion.    C/6-7/-1  Will hope to restart pitocin if amnioinfusion helps variable decelerations.

## 2018-06-17 NOTE — Anesthesia Preprocedure Evaluation (Signed)
Anesthesia Evaluation  Patient identified by MRN, date of birth, ID band Patient awake    Reviewed: Allergy & Precautions, H&P , NPO status , Patient's Chart, lab work & pertinent test results  Airway Mallampati: I  TM Distance: >3 FB Neck ROM: full    Dental no notable dental hx. (+) Teeth Intact   Pulmonary neg pulmonary ROS,    Pulmonary exam normal breath sounds clear to auscultation       Cardiovascular negative cardio ROS   Rhythm:regular Rate:Normal     Neuro/Psych negative neurological ROS  negative psych ROS   GI/Hepatic negative GI ROS, Neg liver ROS,   Endo/Other  negative endocrine ROS  Renal/GU negative Renal ROS  negative genitourinary   Musculoskeletal negative musculoskeletal ROS (+)   Abdominal Normal abdominal exam  (+)   Peds  Hematology negative hematology ROS (+)   Anesthesia Other Findings   Reproductive/Obstetrics (+) Pregnancy                             Anesthesia Physical Anesthesia Plan  ASA: II  Anesthesia Plan: Epidural   Post-op Pain Management:    Induction:   PONV Risk Score and Plan: 3 and Ondansetron, Dexamethasone and Scopolamine patch - Pre-op  Airway Management Planned: Nasal Cannula and Natural Airway  Additional Equipment:   Intra-op Plan:   Post-operative Plan:   Informed Consent: I have reviewed the patients History and Physical, chart, labs and discussed the procedure including the risks, benefits and alternatives for the proposed anesthesia with the patient or authorized representative who has indicated his/her understanding and acceptance.       Plan Discussed with: CRNA  Anesthesia Plan Comments:         Anesthesia Quick Evaluation

## 2018-06-17 NOTE — H&P (Signed)
Yvonne Watts is a 33 y.o. female G1P0 at 19 1/7 weeks (EDD 07/07/18 by LMP c/w 8 week [place]) presenting for ROM about midnight and onset of mild contractions.  Prenatal care complicated by placenta previa that ultimately resolved on 36 week [place]).  She had a chlamydia infection in first trimester but was treated with negative TOC and partner treated as well.   OB History    Gravida  1   Para      Term      Preterm      AB      Living        SAB      TAB      Ectopic      Multiple      Live Births             Past Medical History:  Diagnosis Date  . Medical history non-contributory    Past Surgical History:  Procedure Laterality Date  . NO PAST SURGERIES     Family History: family history is not on file. Social History:  reports that she has never smoked. She has never used smokeless tobacco. She reports previous alcohol use. She reports that she does not use drugs.     Maternal Diabetes: No Genetic Screening: Normal Maternal Ultrasounds/Referrals: Abnormal:  Findings:   Isolated choroid plexus cyst Fetal Ultrasounds or other Referrals:  None Maternal Substance Abuse:  No Significant Maternal Medications:  None Significant Maternal Lab Results:  None Other Comments:  None  Review of Systems  Constitutional: Negative for fever.  Gastrointestinal: Positive for abdominal pain.  Neurological: Negative for headaches.   Maternal Medical History:  Reason for admission: Rupture of membranes.   Contractions: Onset was 6-12 hours ago.   Frequency: regular.   Perceived severity is strong.    Fetal activity: Perceived fetal activity is normal.    Prenatal complications: no prenatal complications Prenatal Complications - Diabetes: none.    Dilation: 3 Effacement (%): 70 Station: -2 Exam by:: [employer] Rn Blood pressure 107/73, pulse 72, temperature 98.4 F (36.9 C), temperature source Oral, resp. rate 18, height 5\' 4"  (1.626 m), weight 63.3 kg, SpO2 94  %. Maternal Exam:  Uterine Assessment: Contraction strength is moderate.  Contraction frequency is regular.   Abdomen: Patient reports no abdominal tenderness. Fetal presentation: vertex  Introitus: Normal vulva.   Physical Exam  Constitutional: She appears well-developed.  Cardiovascular: Normal rate and regular rhythm.  Respiratory: Effort normal.  GI: Soft.  Genitourinary:    Vulva and vagina normal.   Neurological: She is alert.  Psychiatric: She has a normal mood and affect.    Prenatal labs: ABO, Rh: --/--/A POS, A POS Performed at [facility], [address] [place], [place] [address]  [phone]) Antibody: NEG (03/07 0146) Rubella:  Immune RPR:   NR HBsAg:   Neg HIV:   NR GBS:   Neg One hour GCT 119 Panorama and AFP negative Hgb AA  Assessment/Plan: Pt admitted with SROM and aumented with pitocin.  Received epidural and comfortable.  Following progress and will place IUPC if needed to adjust pitocin.   FHR overall category 1, some occasional early decels.  Oliver Pila 06/17/2018, 6:36 AM

## 2018-06-17 NOTE — Anesthesia Postprocedure Evaluation (Signed)
Anesthesia Post Note  Patient: Yvonne Watts  Procedure(s) Performed: CESAREAN SECTION (N/A )     Patient location during evaluation: PACU Anesthesia Type: Epidural Level of consciousness: awake Pain management: pain level controlled Vital Signs Assessment: post-procedure vital signs reviewed and stable Respiratory status: spontaneous breathing Cardiovascular status: stable Postop Assessment: no headache, no backache, epidural receding, patient able to bend at knees and no apparent nausea or vomiting Anesthetic complications: no    Last Vitals:  Vitals:   06/17/18 1419 06/17/18 1515  BP: 104/66 101/69  Pulse: 87 77  Resp: 16 17  Temp: 36.9 C 36.7 C  SpO2: 96% 97%    Last Pain:  Vitals:   06/17/18 1515  TempSrc: Oral  PainSc:    Pain Goal: Patients Stated Pain Goal: 2 (06/17/18 1315)                 Caren Macadam

## 2018-06-17 NOTE — Progress Notes (Signed)
Pt up to BR at 0130

## 2018-06-18 ENCOUNTER — Encounter (HOSPITAL_COMMUNITY): Payer: Self-pay | Admitting: Obstetrics and Gynecology

## 2018-06-18 LAB — CBC
HCT: 22.9 % — ABNORMAL LOW (ref 36.0–46.0)
Hemoglobin: 7.4 g/dL — ABNORMAL LOW (ref 12.0–15.0)
MCH: 28.5 pg (ref 26.0–34.0)
MCHC: 32.3 g/dL (ref 30.0–36.0)
MCV: 88.1 fL (ref 80.0–100.0)
Platelets: 147 10*3/uL — ABNORMAL LOW (ref 150–400)
RBC: 2.6 MIL/uL — ABNORMAL LOW (ref 3.87–5.11)
RDW: 12.9 % (ref 11.5–15.5)
WBC: 14.1 10*3/uL — ABNORMAL HIGH (ref 4.0–10.5)
nRBC: 0 % (ref 0.0–0.2)

## 2018-06-18 NOTE — Anesthesia Postprocedure Evaluation (Signed)
Anesthesia Post Note  Patient: Yvonne Watts  Procedure(s) Performed: CESAREAN SECTION (N/A )     Patient location during evaluation: Mother Baby Anesthesia Type: Epidural Level of consciousness: awake and alert and oriented Pain management: satisfactory to patient Vital Signs Assessment: post-procedure vital signs reviewed and stable Respiratory status: respiratory function stable Cardiovascular status: stable Postop Assessment: no headache, no backache, epidural receding, patient able to bend at knees, no signs of nausea or vomiting and adequate PO intake Anesthetic complications: no    Last Vitals:  Vitals:   06/18/18 0324 06/18/18 0726  BP: 98/72 93/69  Pulse: 71 75  Resp: 18 17  Temp: 36.7 C 36.7 C  SpO2: 99% 97%    Last Pain:  Vitals:   06/18/18 0736  TempSrc:   PainSc: 0-No pain   Pain Goal: Patients Stated Pain Goal: 2 (06/17/18 1315)                 [name]

## 2018-06-18 NOTE — Lactation Note (Signed)
This note was copied from a baby's chart. Lactation Consultation Note Baby 18 hrs old. 1st baby.  Mom has small breast, small areolas, small nipples. Rt. Nipple everted w/bruise to tip, Lt. Nipple short shaft everted nipple. Mom's breast tender w/hand expression. Dot of colostrum to Lt. Nipple, unable to obtain at this time to Rt. Nipple.  Discussed positioning, support, breast massage, hand expression, STS, I&O. Note accessory gland noted to Lt. Axillary w/slight swollen. Discussed possible increase and management.  Shells given to wear this am w/bra to assist in everting nipple more.  LC concerned about minimal compressibility d/t minimal breast tissue. Wide space between breast. No curve or breast tissue defined in between breast. Mom states had increase in size, then both her and FOB laughed. Stated "bigger than they were but is still very small". Mom has DEBP at bedside, last pumped 2 hrs ago. Praised mom. Encouraged to pump q3 hrs for stimulation. Mom has DEBP at home.  Baby fixing to get bath.  Encouraged mom not to let baby latch w/o obtaining deep latch. encouraged to call for assistance. Brochure left at bedside, highlighted briefly.  Patient Name: Yvonne Watts Today's Date: 06/18/2018 Reason for consult: Initial assessment;1st time breastfeeding   Maternal Data Has patient been taught Hand Expression?: Yes Does the patient have breastfeeding experience prior to this delivery?: No  Feeding Feeding Type: Bottle Fed - Formula Nipple Type: Slow - flow  LATCH Score       Type of Nipple: Everted at rest and after stimulation(short shaft)  Comfort (Breast/Nipple): Filling, red/small blisters or bruises, mild/mod discomfort        Interventions Interventions: Breast feeding basics reviewed;Support pillows;Position options;Breast massage;Hand express;Pre-pump if needed;Shells;Breast compression  Lactation Tools Discussed/Used Tools: Shells;Pump Shell Type: Inverted Breast  pump type: Double-Electric Breast Pump WIC Program: No Pump Review: Setup, frequency, and cleaning;Milk Storage Initiated by:: RN Date initiated:: 06/17/18   Consult Status Consult Status: Follow-up Date: 06/18/18 Follow-up type: In-patient    Charyl Dancer 06/18/2018, 6:57 AM

## 2018-06-18 NOTE — Progress Notes (Signed)
Subjective: Postpartum Day 1: Cesarean Delivery Patient reports tolerating PO and no problems voiding.  Ambulating without dizziness.  Objective: Vital signs in last 24 hours: Temp:  [98 F (36.7 C)-98.7 F (37.1 C)] 98.1 F (36.7 C) (03/08 0726) Pulse Rate:  [71-122] 75 (03/08 0726) Resp:  [16-24] 17 (03/08 0726) BP: (91-114)/(58-76) 93/69 (03/08 0726) SpO2:  [93 %-100 %] 97 % (03/08 0726)  Physical Exam:  General: alert and cooperative Lochia: appropriate Uterine Fundus: firm Incision: C/D/I }  Recent Labs    06/17/18 0146 06/18/18 0709  HGB 10.4* 7.4*  HCT 32.9* 22.9*    Assessment/Plan: Status post Cesarean section. Postoperative course complicated by relative anemia but thus far pt asymptomatic  Continue current care.  Oliver Pila 06/18/2018, 10:57 AM

## 2018-06-18 NOTE — Addendum Note (Signed)
Addendum  created 06/18/18 0946 by Graciela Husbands, CRNA   Clinical Note Signed

## 2018-06-19 NOTE — Addendum Note (Signed)
Addendum  created 06/19/18 1150 by Orlie Pollen, CRNA   Clinical Note Signed

## 2018-06-19 NOTE — Anesthesia Postprocedure Evaluation (Signed)
Anesthesia Post Note  Patient: Divina Abare  Procedure(s) Performed: CESAREAN SECTION (N/A )     Patient location during evaluation: Mother Baby Anesthesia Type: Epidural Level of consciousness: awake and alert Pain management: pain level controlled Vital Signs Assessment: post-procedure vital signs reviewed and stable Respiratory status: spontaneous breathing, nonlabored ventilation and respiratory function stable Cardiovascular status: stable Postop Assessment: no headache, no backache and epidural receding Anesthetic complications: no    Last Vitals:  Vitals:   06/18/18 2231 06/19/18 0510  BP: 104/66 102/66  Pulse: 79 74  Resp: 18 18  Temp: 36.8 C 36.8 C  SpO2:  98%    Last Pain:  Vitals:   06/19/18 0853  TempSrc:   PainSc: 4    Pain Goal: Patients Stated Pain Goal: 2 (06/17/18 1315)                 Marrion Coy

## 2018-06-19 NOTE — Progress Notes (Signed)
POD #2 LTCS Doing ok.  Had to have foley replaced yesterday for urinary retention.  No other problems other than some pain Afeb, VSS Abd- soft, fundus firm, incision intact Will d/c foley, continue routine care

## 2018-06-19 NOTE — Progress Notes (Signed)
Voiding without difficulty, has also had BM x 2 Wants to go home in the morning

## 2018-06-19 NOTE — Lactation Note (Signed)
This note was copied from a baby's chart. Lactation Consultation Note  Patient Name: Yvonne Watts LIDCV'U Date: 06/19/2018 Reason for consult: Follow-up assessment;Early term 37-38.6wks;Primapara;1st time breastfeeding  P1 mother whose infant is now 83 hours old.    Mother was using the DEBP as I arrived.  She was laying back at a 45 degree angle.  Discussed positioning and reviewed basic pumping education with parents.  Mother has minimal breast tissue and breasts are wide spaced.  Her areola is small and nipples are everted and intact.    According to the flowsheets, baby has not breast fed since 06/18/18 at 0030.  Mother stated that she would like to breast feed but "I don't have any milk."  Reviewed breast feeding basics with parents.  Mother has a DEBP at bedside but has only pumped twice.  Discussed the importance of putting baby to breast 8-12 times/24 hours or sooner if she shows feeding cues.  Encouraged mother to pump immediately after for 15 minutes to help increase milk supply.  Mother is familiar with hand expression but has only been able to express a few drops at this time.  Observed mother pumping for the remainder of her pumping session.  She was unable to obtain more than a couple of drops which she rubbed into her nipples/areolas for comfort.  Mother denies pain with pumping.    Reviewed the supplementation guideline sheet with the parents.  Father had many questions which I answered to his satisfaction.  Praised mother for her pumping efforts, but, reiterated the importance of being diligent with latching baby and then pumping.  Discussed the large number of formula bottles baby has received and how this is a hindrance to breast feeding and breast supply.  Parents unaware and happy for the advice.  Mother will pump more frequently at home.  She already has a DEBP for home use.    I will make an OP lactation consult for her for a follow up visit this week if possible.  Parents need  reassurance and continued education.  Parents are receptive to learning and happy for any guidance we can provide.  RN updated.    Maternal Data Formula Feeding for Exclusion: Yes Reason for exclusion: Mother's choice to formula and breast feed on admission Has patient been taught Hand Expression?: Yes Does the patient have breastfeeding experience prior to this delivery?: No  Feeding Feeding Type: Bottle Fed - Formula Nipple Type: Slow - flow  LATCH Score                   Interventions    Lactation Tools Discussed/Used WIC Program: No Initiated by:: Already initiated   Consult Status Consult Status: Complete Date: 06/19/18 Follow-up type: Call as needed    Yvonne Watts R Yvonne Watts 06/19/2018, 10:44 AM

## 2018-06-20 MED ORDER — OXYCODONE HCL 5 MG PO TABS: 5.0000 mg | ORAL_TABLET | Freq: Four times a day (QID) | ORAL | 0 refills | Status: AC | PRN

## 2018-06-20 MED ORDER — IBUPROFEN 800 MG PO TABS: 800.0000 mg | ORAL_TABLET | Freq: Four times a day (QID) | ORAL | 1 refills | Status: AC | PRN

## 2018-06-20 MED ORDER — PRENATAL MULTIVITAMIN CH: 1.0000 | ORAL_TABLET | Freq: Every day | ORAL | 3 refills | Status: AC

## 2018-06-20 NOTE — Lactation Note (Signed)
This note was copied from a baby's chart. Lactation Consultation Note Mom hopes to be D/C home today. Mom has OP LC consult. Mom is only bottle formula feeding at this time.  Baby had recently fed 1 hr ago and sleeping soundly.  Mom states she is using DEBP now w/a drop or 2 colostrum. Noted vertical stripe to Lt. Nipple.  Parents asked about using SNS. They would like to try for next feeding. Asked parents to call for BF assistance w/LC, not to give formula for the next feeding. See if LC can work w/mom on feeding baby. Parents agreed.  Discussed may need to have several BF before going home today if plans to BF at home.  Patient Name: Yvonne Watts SHUOH'F Date: 06/20/2018     Maternal Data    Feeding Feeding Type: Bottle Fed - Formula Nipple Type: Regular  LATCH Score                   Interventions    Lactation Tools Discussed/Used     Consult Status      [name] 06/20/2018, 6:05 AM

## 2018-06-20 NOTE — Lactation Note (Signed)
This note was copied from a baby's chart. Lactation Consultation Note  Patient Name: Yvonne Watts EGBTD'V Date: 06/20/2018 Reason for consult: Follow-up assessment;Primapara;1st time breastfeeding;Difficult latch;Early term 37-38.6wks;Nipple pain/trauma  Visited with P1 Mom of ET infant at 36 hrs old, on day of discharge.  Baby at 3% weight loss today.  Baby has been exclusively formula feeding by bottle, last bottle 45 ml.    Asked Mom what LC can do to help.  Mom doesn't seem sure about breastfeeding or formula feeding.  Pump set up at bedside, FOB said Mom has been pumping every 3 hrs.  No washing of pump parts done, and pump parts looks clean in bag hanging on pump. Unwrapped baby, and baby immediately started cueing.    Offered to assist and assess baby at the breast.  Reviewed breast massage and hand expression.  Mom wincing in pain.  Unable to identify any skin breakdown on nipple.  Mom has small breasts with erect nipples.  Mom reports + breast changes with early pregnancy.  Baby positioned STS in cross cradle hold, showing Mom how to sandwich breast in a U hold.  Baby opens wide but sits on breast, and then slips off.  FOB interested in SNS at the breast, which was mentioned earlier by [facility].  Mom agreed to try.  Parents taught how to supplement baby using 5fr feeding tube and syringe when baby is latched deeply to breast.  Tube taped and Mom able to latch baby deeply.  Baby relaxed and latched with a wide open mouth.  Demonstrated how to tug on chin to uncurl lower lip.  Parents very happy about baby breastfeeding with supplement.  Engorgement prevention and treatment reviewed.  Mom instructed to post breastfeed double pump (has Spectra DEBP at home).  OP lactation appointment for 3.12 at 2pm.  Plan- 1- Keep baby STS as much as possible 2- Feed baby with feeding cues.  Latch baby deeply to breast, using 45 ml formula+/EBM in 5 fr feeding tube and syringe at breast. 3- Pump both breasts  15-20 mins, using breast massage and hand expression as well. 4- Call prn for concerns 5- OP lactation appointment 06/22/18 @ 2pm.  Feeding Feeding Type: Formula  LATCH Score Latch: Grasps breast easily, tongue down, lips flanged, rhythmical sucking.  Audible Swallowing: Spontaneous and intermittent(with supplement)  Type of Nipple: Everted at rest and after stimulation  Comfort (Breast/Nipple): Filling, red/small blisters or bruises, mild/mod discomfort  Hold (Positioning): Assistance needed to correctly position infant at breast and maintain latch.  LATCH Score: 8  Interventions Interventions: Breast feeding basics reviewed;Assisted with latch;Skin to skin;Breast massage;Hand express;Support pillows;Adjust position;Breast compression;Position options;Expressed milk;Shells;DEBP  Lactation Tools Discussed/Used Tools: Shells;Pump;55F feeding tube / Syringe Shell Type: Inverted Breast pump type: Double-Electric Breast Pump   Consult Status Consult Status: Complete Date: 06/22/18 Follow-up type: Out-patient    Yvonne Watts 06/20/2018, 9:02 AM

## 2018-06-20 NOTE — Progress Notes (Signed)
Subjective: Postpartum Day 3: Cesarean Delivery Patient reports incisional pain, tolerating PO and no problems voiding.  Nl lochia, pain controlled  Objective: Vital signs in last 24 hours: Temp:  [98.4 F (36.9 C)-98.8 F (37.1 C)] 98.4 F (36.9 C) (03/10 0601) Pulse Rate:  [78-86] 78 (03/10 0601) Resp:  [16-18] 18 (03/10 0601) BP: (97-107)/(63-75) 103/75 (03/10 0601) SpO2:  [97 %] 97 % (03/10 0601)  Physical Exam:  General: alert and no distress Lochia: appropriate Uterine Fundus: firm Incision: healing well DVT Evaluation: No evidence of DVT seen on physical exam.  Recent Labs    06/18/18 0709  HGB 7.4*  HCT 22.9*    Assessment/Plan: Status post Cesarean section. Doing well postoperatively.  Discharge home with standard precautions and return to clinic in 2 and 6 weeks.  D/C with Motrin, PNV and oxycodone.  Lakitha Gordy Bovard-Stuckert 06/20/2018, 8:34 AM

## 2018-06-20 NOTE — Discharge Summary (Signed)
OB Discharge Summary     Patient Name: Yvonne Watts DOB: 1985-05-17 MRN: [id number]  Date of admission: 06/17/2018 Delivering MD: Huel Cote   Date of discharge: 06/20/2018  Admitting diagnosis: 37 wks, leaking fluids Intrauterine pregnancy: [redacted]w[redacted]d     Secondary diagnosis:  Active Problems:   Indication for care in labor or delivery   S/P primary low transverse C-section  Additional problems: N/A     Discharge diagnosis: Term Pregnancy Delivered                                                                                                Post partum procedures:N/A  Augmentation: Pitocin  Complications: None  Hospital course:  Onset of Labor With Unplanned C/S  33 y.o. yo G1P1001 at [redacted]w[redacted]d was admitted in Latent Labor on 06/17/2018. Patient had a labor course significant for repetitive variable decels. Membrane Rupture Time/Date: 12:01 AM ,06/17/2018   The patient went for cesarean section due to repetitive variable decels, and delivered a Viable infant,06/17/2018  Details of operation can be found in separate operative note. Patient had an uncomplicated postpartum course.  She is ambulating,tolerating a regular diet, passing flatus, and urinating well.  Patient is discharged home in stable condition 06/20/18.  Physical exam  Vitals:   06/19/18 0510 06/19/18 1423 06/19/18 2130 06/20/18 0601  BP: 102/66 107/63 97/70 103/75  Pulse: 74 86 85 78  Resp: 18 16 16 18   Temp: 98.2 F (36.8 C) 98.5 F (36.9 C) 98.8 F (37.1 C) 98.4 F (36.9 C)  TempSrc: Oral Oral Oral Oral  SpO2: 98%   97%  Weight:      Height:       General: alert, cooperative and no distress Lochia: appropriate Uterine Fundus: firm Incision: Healing well with no significant drainage DVT Evaluation: No evidence of DVT seen on physical exam. Labs: Lab Results  Component Value Date   WBC 14.1 (H) 06/18/2018   HGB 7.4 (L) 06/18/2018   HCT 22.9 (L) 06/18/2018   MCV 88.1 06/18/2018   PLT 147 (L) 06/18/2018    No flowsheet data found.  Discharge instruction: per After Visit Summary and "Baby and Me Booklet".  After visit meds:  Allergies as of 06/20/2018   No Known Allergies     Medication List    STOP taking these medications   NIFEdipine 30 MG 24 hr tablet Commonly known as:  Procardia XL     TAKE these medications   ibuprofen 800 MG tablet Commonly known as:  ADVIL,MOTRIN Take 1 tablet (800 mg total) by mouth every 6 (six) hours as needed.   oxyCODONE 5 MG immediate release tablet Commonly known as:  Oxy IR/ROXICODONE Take 1-2 tablets (5-10 mg total) by mouth every 6 (six) hours as needed for moderate pain or severe pain.   prenatal multivitamin Tabs tablet Take 1 tablet by mouth daily at 12 noon.       Diet: routine diet  Activity: Advance as tolerated. Pelvic rest for 6 weeks.   Outpatient follow up:2 and 6 weeks Follow up Appt:No future appointments. Follow up Visit:No follow-ups on file.  Postpartum contraception: Undecided  Newborn Data: Live born female  Birth Weight: 6 lb 5.1 oz (2866 g) APGAR: 8, 9  Newborn Delivery   Birth date/time:  06/17/2018 11:58:00 Delivery type:  C-Section, Low Transverse Trial of labor:  Yes C-section categorization:  Primary     Baby Feeding: Bottle and Breast Disposition:home with mother   06/20/2018 Sherian Rein, MD

## 2019-10-02 IMAGING — US US MFM OB TRANSVAGINAL
1 series · 15 of 28 positions shown · non-contrast
Comparison: none

[Series 1: us mfm ob transvaginal · 15 of 51 slices shown]
[im 1/51]
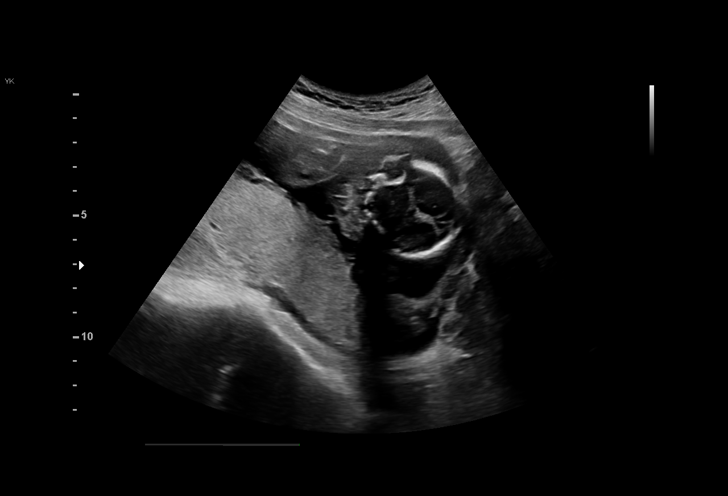
[im 4/51]
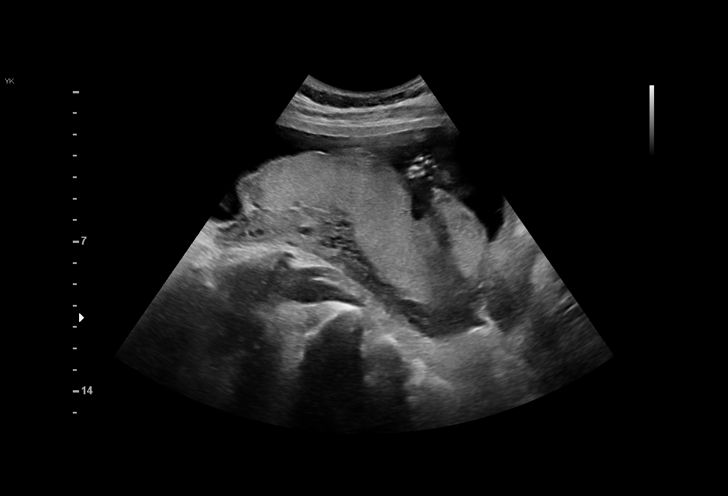
[im 8/51]
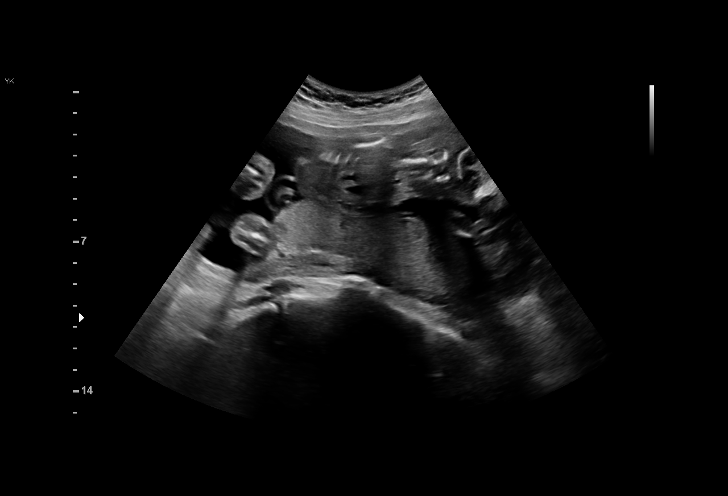
[im 12/51]
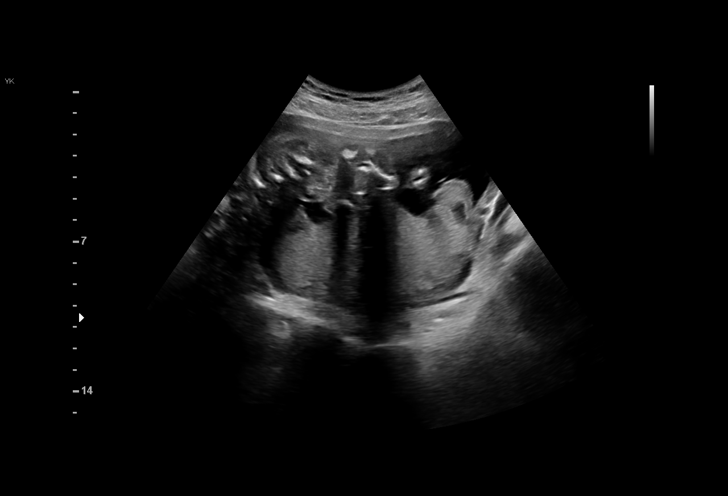
[im 15/51]
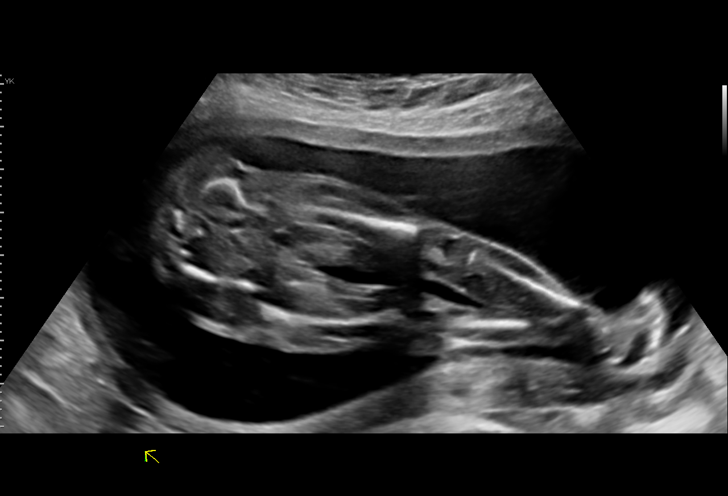
[im 19/51]
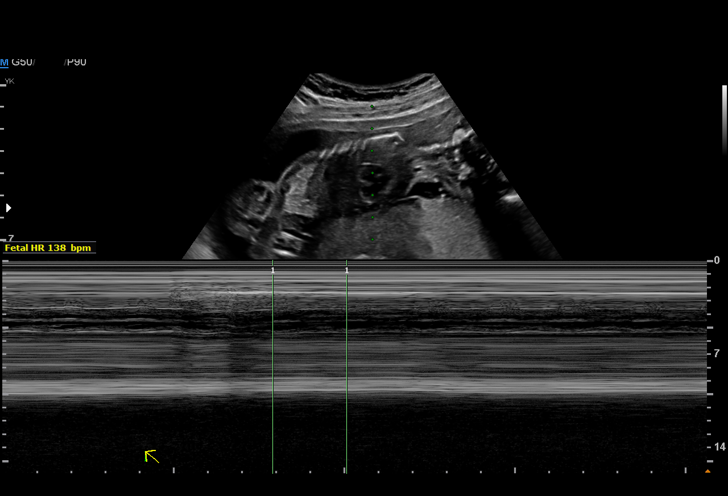
[im 23/51]
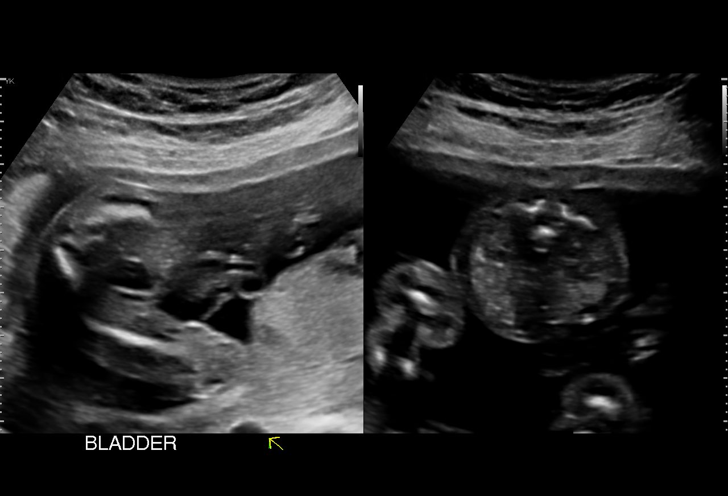
[im 26/51]
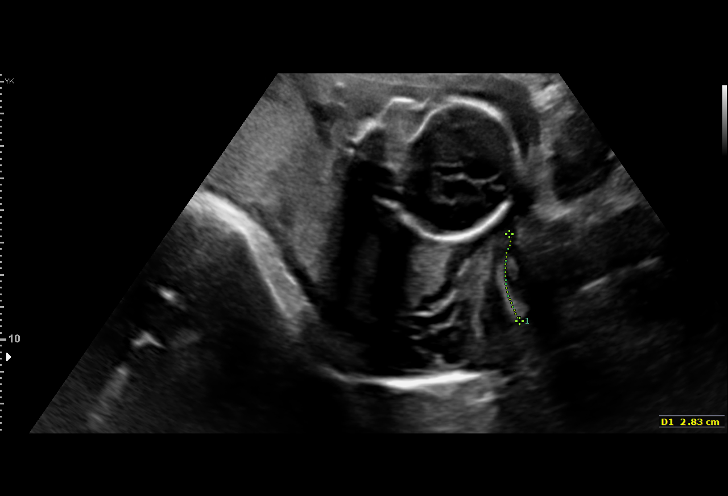
[im 28/51]
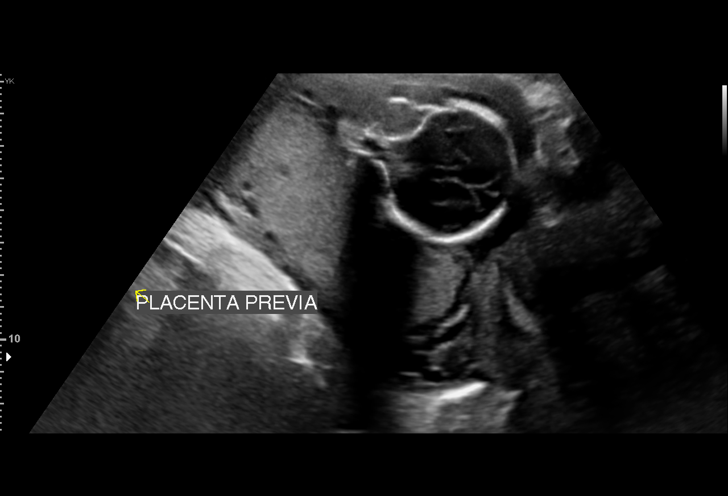
[im 32/51]
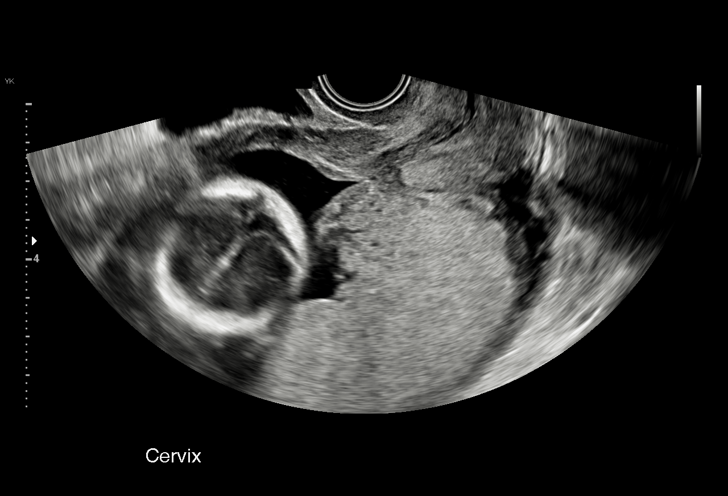
[im 36/51]
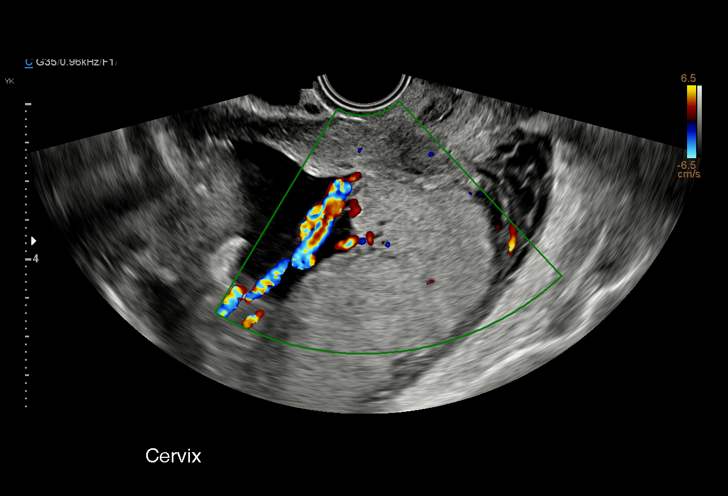
[im 39/51]
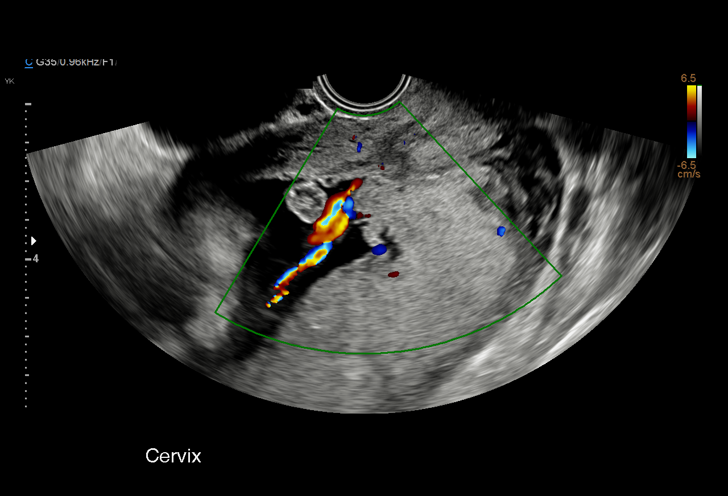
[im 43/51]
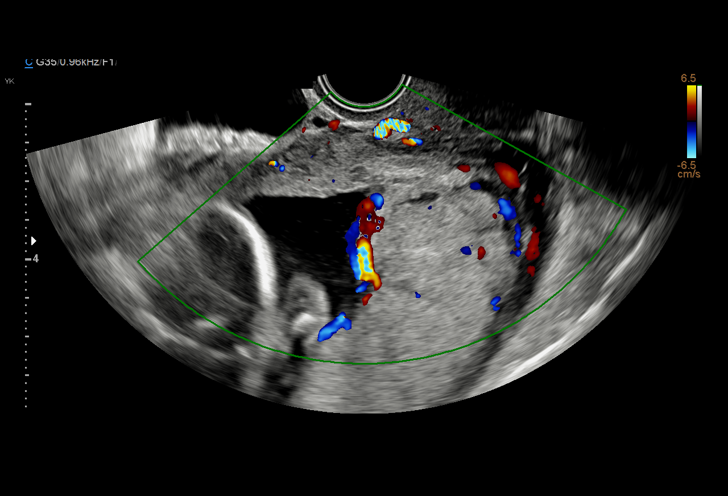
[im 47/51]
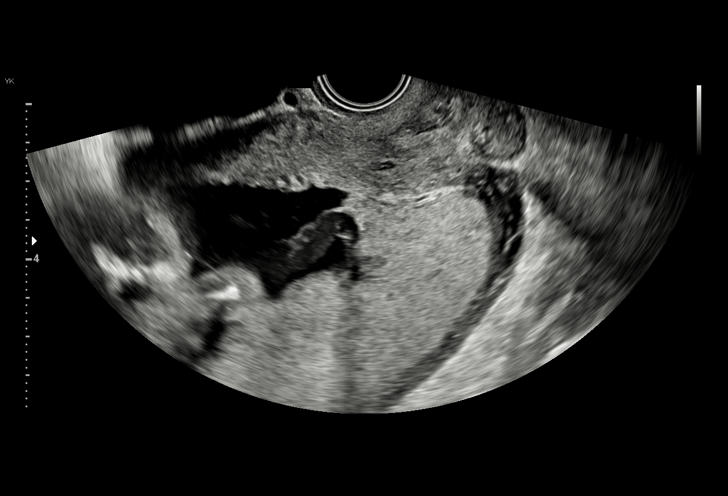
[im 51/51]
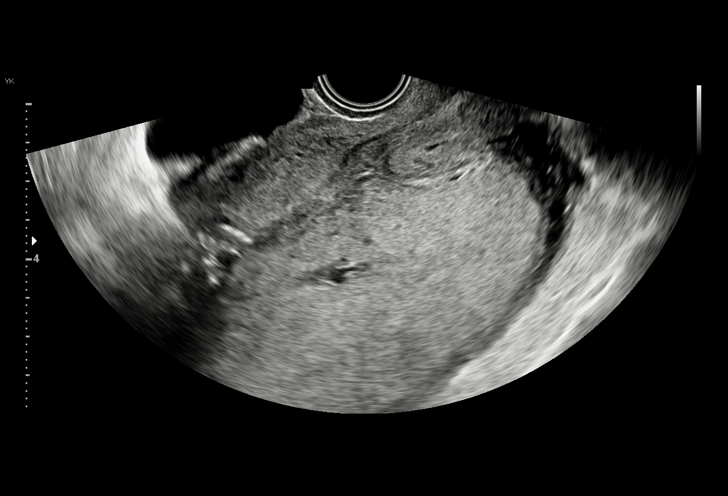

[15 of 28 positions shown; findings below may reference images not displayed]

Attending:        Kanaci Estetike            Secondary Phy.:    IHTHI DE Nursing-
                                                             MAU/Triage

  1  US MFM OB LIMITED                     76815.01    QUIRIJN AMAZIGH
  2  US MFM OB TRANSVAGINAL                76817.2     QUIRIJN AMAZIGH
 ----------------------------------------------------------------------

 ----------------------------------------------------------------------
Indications

  Placenta previa with hemorrhage, second
  trimester
  Vaginal bleeding in pregnancy, second
  trimester
  20 weeks gestation of pregnancy
 ----------------------------------------------------------------------
Fetal Evaluation

 Num Of Fetuses:          1
 Fetal Heart Rate(bpm):   138
 Cardiac Activity:        Observed
 Presentation:            Cephalic
 Placenta:                Posterior Previa
 P. Cord Insertion:       Marginal insertion

 Amniotic Fluid
 AFI FV:      Within normal limits

                             Largest Pocket(cm)

OB History

 Gravidity:    1
Gestational Age

 LMP:           20w 1d        Date:  09/30/17                 EDD:   07/07/18
 Best:          20w 1d     Det. By:  LMP  (09/30/17)          EDD:   07/07/18
Anatomy

 Ventricles:            Appears normal         Bladder:                Appears normal
 Kidneys:               Appear normal          Lower Extremities:      Visualized
Cervix Uterus Adnexa

 Cervix
 Length:           3.45  cm.
 Normal appearance by transvaginal scan
Impression

 IUP at 20w 1d
 Placenta Previa with Marginal placental cord insertion
 adjacent to cervix os
 Cervical Length = 3.45 cms
Recommendations

 Bleeding precautions with reevaluation in 2-3 weeks.

                     Elder, [place]
